# Patient Record
Sex: Male | Born: 1965 | Race: White | Hispanic: No | Smoking: Former smoker
Health system: Southern US, Community
[De-identification: ages and names within clinical notes are randomized; demographics above are authoritative.]

## PROBLEM LIST (undated history)

## (undated) DIAGNOSIS — I10 Essential (primary) hypertension: Secondary | ICD-10-CM

## (undated) HISTORY — PX: TOOTH EXTRACTION: SUR596

## (undated) HISTORY — DX: Essential (primary) hypertension: I10

---

## 1998-06-15 ENCOUNTER — Emergency Department (HOSPITAL_COMMUNITY): Admission: EM | Admit: 1998-06-15 | Discharge: 1998-06-15 | Payer: Self-pay | Admitting: Endocrinology

## 1999-03-12 ENCOUNTER — Emergency Department (HOSPITAL_COMMUNITY): Admission: EM | Admit: 1999-03-12 | Discharge: 1999-03-12 | Payer: Self-pay | Admitting: Emergency Medicine

## 2014-11-01 ENCOUNTER — Emergency Department (INDEPENDENT_AMBULATORY_CARE_PROVIDER_SITE_OTHER)
Admission: EM | Admit: 2014-11-01 | Discharge: 2014-11-01 | Disposition: A | Discharge status: Home / Self Care | Payer: Self-pay | Source: Home / Self Care | Attending: Internal Medicine | Admitting: Internal Medicine

## 2014-11-01 ENCOUNTER — Encounter (HOSPITAL_COMMUNITY): Payer: Self-pay | Admitting: Emergency Medicine

## 2014-11-01 DIAGNOSIS — S63602A Unspecified sprain of left thumb, initial encounter: Secondary | ICD-10-CM

## 2014-11-01 MED ORDER — NAPROXEN 500 MG PO TABS: 500.0000 mg | ORAL_TABLET | Freq: Two times a day (BID) | ORAL | Status: DC

## 2014-11-01 NOTE — Discharge Instructions (Signed)
Gamekeeper's, Skier's Thumb °You have injured some ligaments in your thumb. This can happen suddenly or over a long period of time. It may be difficult for you to hold things by pinching them between your thumb and finger. The injury may take 6 to 8 weeks to heal. Your injury is a strain injury and not a complete tear so it may be treated with a cast. A complete tear of the ligament can lead to a loss of function of the thumb if not fixed surgically. It got its name from when gamekeepers used to kill game (hunted or trapped animals) by pinning them down around the neck between their thumb and index finger.  °HOME CARE INSTRUCTIONS  °· Apply ice to the injury for 15-20 minutes, 03-04 times per day for the first 2 days. Put the ice in a plastic bag and place a towel between the bag of ice and your skin. °· Avoid using your thumb for as long as directed by your caregiver if it is not splinted or in a cast. °· If your hand has been casted or splinted while your thumb heals, follow these instructions: °· Plaster or fiberglass cast: °¨ Do not try to scratch the skin under the cast using a sharp or pointed object. °¨ Check the skin around the cast every day. You may put lotion on any red or sore areas. °¨ Keep your cast dry. Your cast can be protected during bathing with a plastic bag. Do not put your cast into the water. °¨ If your fiberglass cast gets wet, it can be gently dried using a hair dryer. Be careful not burn yourself. °· Plaster splint: °¨ Wear the splint for as long as directed by your caregiver or until you are seen for a follow-up examination. °¨ Do not get your splint wet. Protect it during bathing with a plastic bag. °¨ You may loosen the elastic bandage around the splint if your fingers start to get numb, tingle, get cold, or turn blue. °· Do not put pressure on your cast or splint; this may cause it to break. Do not lean on hard surfaces for 24 hours after application. °· Only take over-the-counter or  prescription medicines for pain, discomfort, or fever as directed by your caregiver. °· IMPORTANT: follow up with your caregiver or keep or call for any appointments with specialists as directed. The failure to follow up could result in chronic pain and / or disability. °SEEK IMMEDIATE MEDICAL CARE IF:  °Your thumb or fingers change color, become painful, or there is numbness or tingling in your thumb or fingers. Your cast or splint may be too tight. °MAKE SURE YOU:  °· Understand these instructions. °· Will watch your condition. °· Will get help right away if you are not doing well or get worse. °Document Released: 05/17/2005 Document Revised: 08/09/2011 Document Reviewed: 01/04/2008 °ExitCare® Patient Information ©2015 [employer]. This information is not intended to replace advice given to you by your health care provider. Make sure you discuss any questions you have with your health care provider. ° °
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
## 2014-11-08 ENCOUNTER — Emergency Department (INDEPENDENT_AMBULATORY_CARE_PROVIDER_SITE_OTHER): Payer: Self-pay

## 2014-11-08 ENCOUNTER — Emergency Department (INDEPENDENT_AMBULATORY_CARE_PROVIDER_SITE_OTHER)
Admission: EM | Admit: 2014-11-08 | Discharge: 2014-11-08 | Disposition: A | Discharge status: Home / Self Care | Payer: Self-pay | Source: Home / Self Care | Attending: Emergency Medicine | Admitting: Emergency Medicine

## 2014-11-08 ENCOUNTER — Encounter (HOSPITAL_COMMUNITY): Payer: Self-pay | Admitting: Emergency Medicine

## 2014-11-08 DIAGNOSIS — S63602D Unspecified sprain of left thumb, subsequent encounter: Secondary | ICD-10-CM

## 2014-11-08 NOTE — ED Notes (Signed)
Patient presents for recheck of thumb injury x 1 week ago. Patient reports he did not have his Rx filled. Patient reports he has had thumb pain in different areas. Patient is in NAD.
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
## 2014-11-08 NOTE — Discharge Instructions (Signed)
Continue to use the brace. Remove the brace and do gentle range of motion 3 times a day. Take ibuprofen 600 mg 3 times a day for the next 3 days, then as needed. If you are not improving in the next 1-2 weeks, please follow-up at the sports medicine center.

## 2016-12-17 DIAGNOSIS — I10 Essential (primary) hypertension: Secondary | ICD-10-CM | POA: Diagnosis not present

## 2016-12-17 DIAGNOSIS — R5383 Other fatigue: Secondary | ICD-10-CM | POA: Diagnosis not present

## 2017-04-13 DIAGNOSIS — I10 Essential (primary) hypertension: Secondary | ICD-10-CM | POA: Diagnosis not present

## 2017-10-11 DIAGNOSIS — R609 Edema, unspecified: Secondary | ICD-10-CM | POA: Diagnosis not present

## 2017-10-11 DIAGNOSIS — I10 Essential (primary) hypertension: Secondary | ICD-10-CM | POA: Diagnosis not present

## 2017-10-11 DIAGNOSIS — L0390 Cellulitis, unspecified: Secondary | ICD-10-CM | POA: Diagnosis not present

## 2017-10-25 DIAGNOSIS — R609 Edema, unspecified: Secondary | ICD-10-CM | POA: Diagnosis not present

## 2017-10-25 DIAGNOSIS — I10 Essential (primary) hypertension: Secondary | ICD-10-CM | POA: Diagnosis not present

## 2017-10-26 ENCOUNTER — Ambulatory Visit
Admission: RE | Admit: 2017-10-26 | Discharge: 2017-10-26 | Disposition: A | Discharge status: Home / Self Care | Payer: 59 | Source: Ambulatory Visit | Attending: Family Medicine | Admitting: Family Medicine

## 2017-10-26 ENCOUNTER — Other Ambulatory Visit: Payer: Self-pay

## 2017-10-26 DIAGNOSIS — M7989 Other specified soft tissue disorders: Secondary | ICD-10-CM | POA: Diagnosis not present

## 2017-10-26 DIAGNOSIS — R609 Edema, unspecified: Secondary | ICD-10-CM

## 2017-10-26 DIAGNOSIS — M79605 Pain in left leg: Secondary | ICD-10-CM

## 2017-11-10 DIAGNOSIS — R609 Edema, unspecified: Secondary | ICD-10-CM | POA: Diagnosis not present

## 2017-11-10 DIAGNOSIS — I10 Essential (primary) hypertension: Secondary | ICD-10-CM | POA: Diagnosis not present

## 2017-11-22 ENCOUNTER — Encounter: Payer: Self-pay | Admitting: Cardiovascular Disease

## 2017-11-22 ENCOUNTER — Ambulatory Visit (INDEPENDENT_AMBULATORY_CARE_PROVIDER_SITE_OTHER): Payer: 59 | Admitting: Cardiovascular Disease

## 2017-11-22 VITALS — BP 128/92 | HR 79 | Ht 70.0 in | Wt 256.0 lb

## 2017-11-22 DIAGNOSIS — I10 Essential (primary) hypertension: Secondary | ICD-10-CM | POA: Insufficient documentation

## 2017-11-22 DIAGNOSIS — R601 Generalized edema: Secondary | ICD-10-CM | POA: Diagnosis not present

## 2017-11-22 DIAGNOSIS — R600 Localized edema: Secondary | ICD-10-CM | POA: Insufficient documentation

## 2017-11-22 MED ORDER — FUROSEMIDE 20 MG PO TABS: 40.0000 mg | ORAL_TABLET | Freq: Every day | ORAL | 3 refills | Status: DC

## 2017-11-22 NOTE — Assessment & Plan Note (Signed)
Essential hypertension her blood pressure measured today 120/92.  He is on Bystolic.  Continue current meds at current dosing.

## 2017-11-22 NOTE — Assessment & Plan Note (Signed)
Mr. Dylan Martinez was referred by Dr. Clovis RileyMitchell for evaluation of bilateral lower extremity edema.  This began 2 months ago.  He has had venous Doppler studies that did not show DVT or venous insufficiency.  He has been treated with antibiotics for left lower extremity cellulitis.  The pain in his left leg has improved.  He was placed on oral diuretics which did not significantly improve his edema.  He denies chest pain or shortness of breath.  I am going to increase his furosemide from 20 mg in the morning to 40 mg and will check a basic metabolic panel in 2 weeks.  I am also going to get a 2D echocardiogram and will see him back after that for further evaluation.

## 2017-11-22 NOTE — Progress Notes (Signed)
11/22/2017 Dylan Martinez   09-16-1965  [id number]  Primary Physician Dylan Martinez, L.Dylan Saucer, MD Primary Cardiologist: Dylan Gess MD Dylan Martinez, Dylan Martinez, [place]  HPI:  Dylan Martinez is a 52 y.o. moderately overweight recently divorced Caucasian male father of 2 children referred by Dr. Lupe Martinez for cardiovascular evaluation because of lower extremity edema.  He does have a history of essential hypertension on medications.  He works as a Psychologist, counselling.  His risk factors include 70 pack years of tobacco abuse having quit 3 years ago as well as treated hypertension.  There is no family history.  Is never had a heart attack or stroke.  He denies chest pain or shortness of breath.  He does drink 2 mixed drinks and evening.  He began to notice lower extremity edema bilaterally 2 months ago.  He had lower extremity venous Doppler studies performed 10/26/2017 that ruled out DVT.  There is no venous reflux noted either.  He is placed on oral diuretics with minimal response.  He was also treated with antibiotics for cellulitis.   Current Meds  Medication Sig  . furosemide (LASIX) 20 MG tablet Take 2 tablets (40 mg total) by mouth daily.  . nebivolol (BYSTOLIC) 5 MG tablet Take 5 mg by mouth daily.  . [DISCONTINUED] furosemide (LASIX) 20 MG tablet Take 20 mg by mouth 2 (two) times daily.  . [DISCONTINUED] lisinopril-hydrochlorothiazide (PRINZIDE,ZESTORETIC) 20-25 MG per tablet Take 1 tablet by mouth daily.  . [DISCONTINUED] naproxen (NAPROSYN) 500 MG tablet Take 1 tablet (500 mg total) by mouth 2 (two) times daily.     Allergies  Allergen Reactions  . Doxycycline Rash    Social History   Socioeconomic History  . Marital status: Married    Spouse name: Not on file  . Number of children: Not on file  . Years of education: Not on file  . Highest education level: Not on file  Occupational History  . Not on file  Social Needs  . Financial resource strain: Not on file  . Food insecurity:      Worry: Not on file    Inability: Not on file  . Transportation needs:    Medical: Not on file    Non-medical: Not on file  Tobacco Use  . Smoking status: Former Smoker    Packs/day: 1.00    Years: 35.00    Pack years: 35.00    Types: Cigarettes, E-cigarettes    Last attempt to quit: 08/30/2014    Years since quitting: 3.2  . Smokeless tobacco: Never Used  Substance and Sexual Activity  . Alcohol use: Yes    Comment: once a month  . Drug use: No  . Sexual activity: Not on file  Lifestyle  . Physical activity:    Days per week: Not on file    Minutes per session: Not on file  . Stress: Not on file  Relationships  . Social connections:    Talks on phone: Not on file    Gets together: Not on file    Attends religious service: Not on file    Active member of club or organization: Not on file    Attends meetings of clubs or organizations: Not on file    Relationship status: Not on file  . Intimate partner violence:    Fear of current or ex partner: Not on file    Emotionally abused: Not on file    Physically abused: Not on file  Forced sexual activity: Not on file  Other Topics Concern  . Not on file  Social History Narrative  . Not on file     Review of Systems: General: negative for chills, fever, night sweats or weight changes.  Cardiovascular: negative for chest pain, dyspnea on exertion, edema, orthopnea, palpitations, paroxysmal nocturnal dyspnea or shortness of breath Dermatological: negative for rash Respiratory: negative for cough or wheezing Urologic: negative for hematuria Abdominal: negative for nausea, vomiting, diarrhea, bright red blood per rectum, melena, or hematemesis Neurologic: negative for visual changes, syncope, or dizziness All other systems reviewed and are otherwise negative except as noted above.    Blood pressure (!) 128/92, pulse 79, height 5\' 10"  (1.778 m), weight 256 lb (116.1 kg).  General appearance: alert and no distress Neck:  no adenopathy, no carotid bruit, no JVD, supple, symmetrical, trachea midline and thyroid not enlarged, symmetric, no tenderness/mass/nodules Lungs: clear to auscultation bilaterally Heart: regular rate and rhythm, S1, S2 normal, no murmur, click, rub or gallop Extremities: 1-2+ pitting edema bilaterally.  There was some venous stasis changes are resolving cellulitis changes on the left. Pulses: 2+ and symmetric Skin: Skin color, texture, turgor normal. No rashes or lesions Neurologic: Alert and oriented X 3, normal strength and tone. Normal symmetric reflexes. Normal coordination and gait  EKG sinus rhythm at 79 without ST or T wave changes.  I personally reviewed this EKG  ASSESSMENT AND PLAN:   Essential hypertension Essential hypertension her blood pressure measured today 120/92.  He is on Bystolic.  Continue current meds at current dosing.  Bilateral lower extremity edema Mr. Dylan Martinez was referred by Dr. Clovis RileyMitchell for evaluation of bilateral lower extremity edema.  This began 2 months ago.  He has had venous Doppler studies that did not show DVT or venous insufficiency.  He has been treated with antibiotics for left lower extremity cellulitis.  The pain in his left leg has improved.  He was placed on oral diuretics which did not significantly improve his edema.  He denies chest pain or shortness of breath.  I am going to increase his furosemide from 20 mg in the morning to 40 mg and will check a basic metabolic panel in 2 weeks.  I am also going to get a 2D echocardiogram and will see him back after that for further evaluation.      Dylan GessJonathan J. Camila Maita MD FACP,FACC,FAHA, [facility] 11/22/2017 3:52 PM

## 2017-11-22 NOTE — Patient Instructions (Signed)
Medication Instructions:   INCREASE FUROSEMIDE TO 40 MG ONCE DAILY= 2 OF THE 20 MG TABLETS ONCE DAILY  Labwork:  Your physician recommends that you return for lab work in: ONE WEEK  Testing/Procedures:  Your physician has requested that you have an echocardiogram. Echocardiography is a painless test that uses sound waves to create images of your heart. It provides your doctor with information about the size and shape of your heart and how well your heart's chambers and valves are working. This procedure takes approximately one hour. There are no restrictions for this procedure.    Follow-Up:  Your physician recommends that you schedule a follow-up appointment in: 3 WEEKS WITH DR Allyson SabalBERRY

## 2017-11-24 ENCOUNTER — Ambulatory Visit (HOSPITAL_COMMUNITY): Payer: 59 | Attending: Internal Medicine

## 2017-11-24 ENCOUNTER — Other Ambulatory Visit: Payer: Self-pay

## 2017-11-24 DIAGNOSIS — Z87891 Personal history of nicotine dependence: Secondary | ICD-10-CM | POA: Insufficient documentation

## 2017-11-24 DIAGNOSIS — E669 Obesity, unspecified: Secondary | ICD-10-CM | POA: Diagnosis not present

## 2017-11-24 DIAGNOSIS — R601 Generalized edema: Secondary | ICD-10-CM | POA: Diagnosis not present

## 2017-11-24 DIAGNOSIS — I10 Essential (primary) hypertension: Secondary | ICD-10-CM | POA: Insufficient documentation

## 2017-11-24 DIAGNOSIS — R609 Edema, unspecified: Secondary | ICD-10-CM | POA: Insufficient documentation

## 2017-12-13 ENCOUNTER — Encounter: Payer: Self-pay | Admitting: Cardiovascular Disease

## 2017-12-13 ENCOUNTER — Ambulatory Visit (INDEPENDENT_AMBULATORY_CARE_PROVIDER_SITE_OTHER): Payer: 59 | Admitting: Cardiovascular Disease

## 2017-12-13 VITALS — BP 128/82 | HR 79 | Ht 70.0 in | Wt 258.0 lb

## 2017-12-13 DIAGNOSIS — R601 Generalized edema: Secondary | ICD-10-CM

## 2017-12-13 NOTE — Patient Instructions (Signed)
Medication Instructions:   NO CHANGE  Labwork:  Your physician recommends that you HAVE LAB WORK TODAY  Follow-Up:  Your physician recommends that you schedule a follow-up appointment in: AS NEEDED      

## 2017-12-13 NOTE — Progress Notes (Signed)
Mr. Dylan Martinez returns today for follow-up of his 2D echo performed 11/24/2017 entirely normal.  This obviously is not the cause of his edema.  His venous Dopplers are normal as well for no evidence of DVT or reflux.  I did increase his furosemide from 20 to 40 mg a day which did not improve his edema but it did increase his urination.  I am going to get a 24-hour urine for total protein to rule out nephrotic syndrome.  If this is normal I have no further ideas to explain his edema.   Runell GessJonathan J. Armya Westerhoff, M.D., FACP, [facility], Earl LagosFAHA, [facility] [address] [place], [place]  [id number]  [phone] 12/13/2017 3:28 PM

## 2017-12-13 NOTE — Assessment & Plan Note (Signed)
Mr. Oswaldo DoneVincent returns today for follow-up of his 2D echo performed 11/24/2017 entirely normal.  This obviously is not the cause of his edema.  His venous Dopplers are normal as well for no evidence of DVT or reflux.  I did increase his furosemide from 20 to 40 mg a day which did not improve his edema but it did increase his urination.  I am going to get a 24-hour urine for total protein to rule out nephrotic syndrome.  If this is normal I have no further ideas to explain his edema.

## 2017-12-19 ENCOUNTER — Encounter: Payer: Self-pay | Admitting: Cardiovascular Disease

## 2017-12-19 DIAGNOSIS — R601 Generalized edema: Secondary | ICD-10-CM | POA: Diagnosis not present

## 2017-12-19 LAB — PROTEIN, URINE, 24 HOUR
PROTEIN 24H UR: 70 mg/(24.h) (ref 30–150)
Protein, Ur: 6.8 mg/dL

## 2018-01-19 ENCOUNTER — Other Ambulatory Visit: Payer: Self-pay | Admitting: Family Medicine

## 2018-01-19 DIAGNOSIS — R609 Edema, unspecified: Secondary | ICD-10-CM

## 2018-01-27 ENCOUNTER — Ambulatory Visit
Admission: RE | Admit: 2018-01-27 | Discharge: 2018-01-27 | Disposition: A | Discharge status: Home / Self Care | Payer: 59 | Source: Ambulatory Visit | Attending: Family Medicine | Admitting: Family Medicine

## 2018-01-27 DIAGNOSIS — K8020 Calculus of gallbladder without cholecystitis without obstruction: Secondary | ICD-10-CM | POA: Diagnosis not present

## 2018-01-27 DIAGNOSIS — R609 Edema, unspecified: Secondary | ICD-10-CM

## 2018-03-31 DIAGNOSIS — R21 Rash and other nonspecific skin eruption: Secondary | ICD-10-CM | POA: Diagnosis not present

## 2018-03-31 DIAGNOSIS — M359 Systemic involvement of connective tissue, unspecified: Secondary | ICD-10-CM | POA: Diagnosis not present

## 2018-03-31 DIAGNOSIS — I872 Venous insufficiency (chronic) (peripheral): Secondary | ICD-10-CM | POA: Diagnosis not present

## 2018-05-01 DIAGNOSIS — E669 Obesity, unspecified: Secondary | ICD-10-CM | POA: Diagnosis not present

## 2018-05-01 DIAGNOSIS — I10 Essential (primary) hypertension: Secondary | ICD-10-CM | POA: Diagnosis not present

## 2018-05-01 DIAGNOSIS — R609 Edema, unspecified: Secondary | ICD-10-CM | POA: Diagnosis not present

## 2018-05-10 ENCOUNTER — Encounter (HOSPITAL_COMMUNITY): Payer: 59

## 2018-05-18 ENCOUNTER — Other Ambulatory Visit (HOSPITAL_COMMUNITY): Payer: Self-pay

## 2018-05-18 DIAGNOSIS — R0989 Other specified symptoms and signs involving the circulatory and respiratory systems: Secondary | ICD-10-CM

## 2018-05-19 DIAGNOSIS — I872 Venous insufficiency (chronic) (peripheral): Secondary | ICD-10-CM | POA: Diagnosis not present

## 2018-05-22 ENCOUNTER — Inpatient Hospital Stay (HOSPITAL_COMMUNITY): Admission: RE | Admit: 2018-05-22 | Payer: 59 | Source: Ambulatory Visit

## 2018-06-07 ENCOUNTER — Encounter (HOSPITAL_COMMUNITY): Payer: 59

## 2018-06-21 ENCOUNTER — Inpatient Hospital Stay (HOSPITAL_COMMUNITY): Admission: RE | Admit: 2018-06-21 | Payer: 59 | Source: Ambulatory Visit

## 2018-06-29 DIAGNOSIS — I872 Venous insufficiency (chronic) (peripheral): Secondary | ICD-10-CM | POA: Diagnosis not present

## 2018-07-12 ENCOUNTER — Encounter (HOSPITAL_COMMUNITY): Payer: 59

## 2019-10-18 ENCOUNTER — Other Ambulatory Visit: Payer: Self-pay

## 2019-10-18 DIAGNOSIS — R42 Dizziness and giddiness: Secondary | ICD-10-CM

## 2019-10-27 ENCOUNTER — Ambulatory Visit
Admission: RE | Admit: 2019-10-27 | Discharge: 2019-10-27 | Disposition: A | Discharge status: Home / Self Care | Payer: 59 | Source: Ambulatory Visit | Attending: Family Medicine | Admitting: Family Medicine

## 2019-10-27 ENCOUNTER — Other Ambulatory Visit: Payer: Self-pay

## 2019-10-27 DIAGNOSIS — R42 Dizziness and giddiness: Secondary | ICD-10-CM

## 2019-10-27 MED ORDER — GADOBENATE DIMEGLUMINE 529 MG/ML IV SOLN
20.0000 mL | Freq: Once | INTRAVENOUS | Status: AC | PRN
  Administered 2019-10-27: 20 mL via INTRAVENOUS

## 2019-10-28 IMAGING — US US EXTREM LOW VENOUS BILAT
1 series · 13 of 24 positions shown · non-contrast
Comparison: None.

CLINICAL DATA: Bilateral lower extremity pain, swelling



[Series 1: us extrem low venous bilat · 0.09mm/px · 13 of 62 slices shown]
[im 1/62]
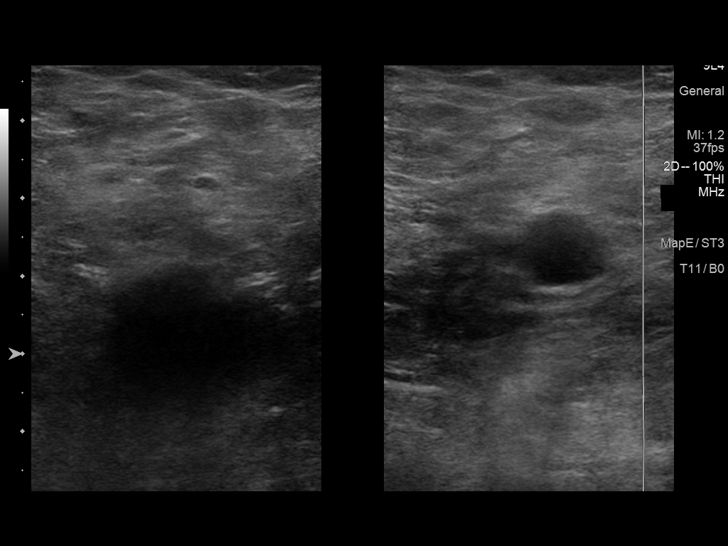
[im 6/62]
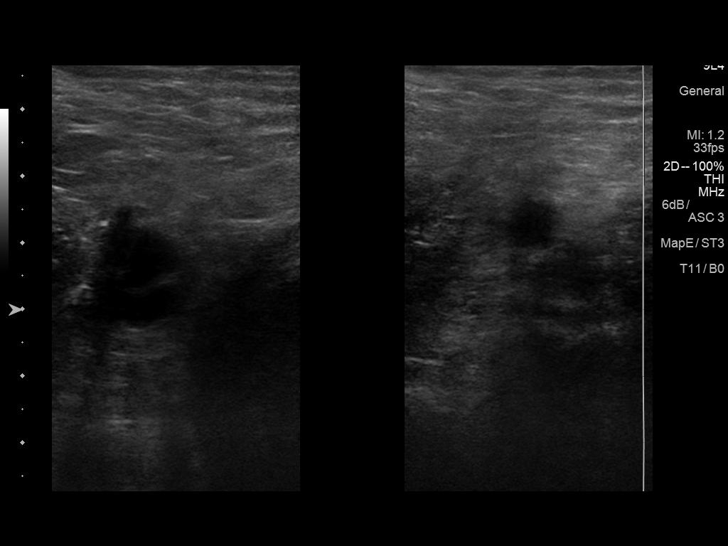
[im 11/62]
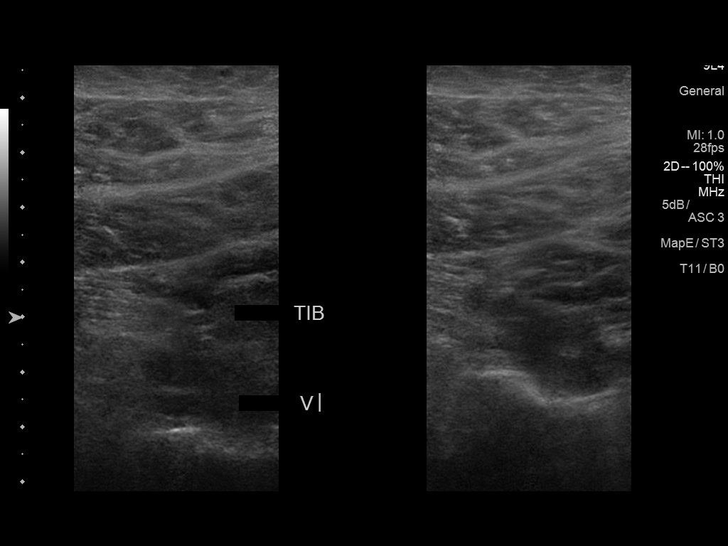
[im 16/62]
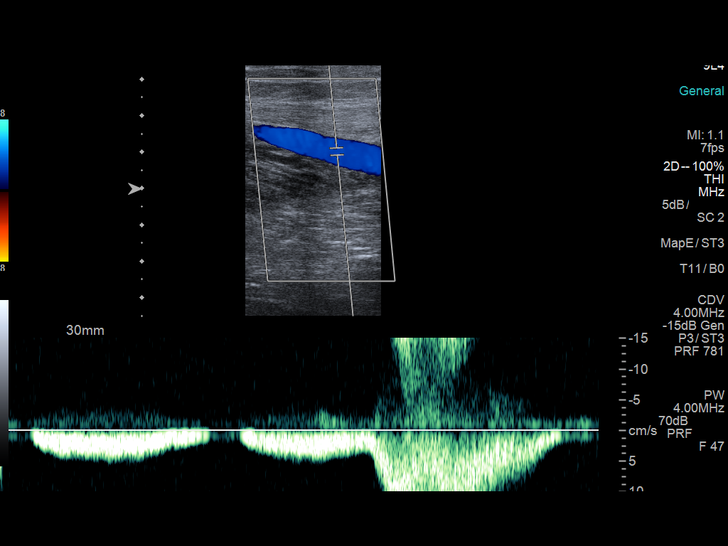
[im 22/62]
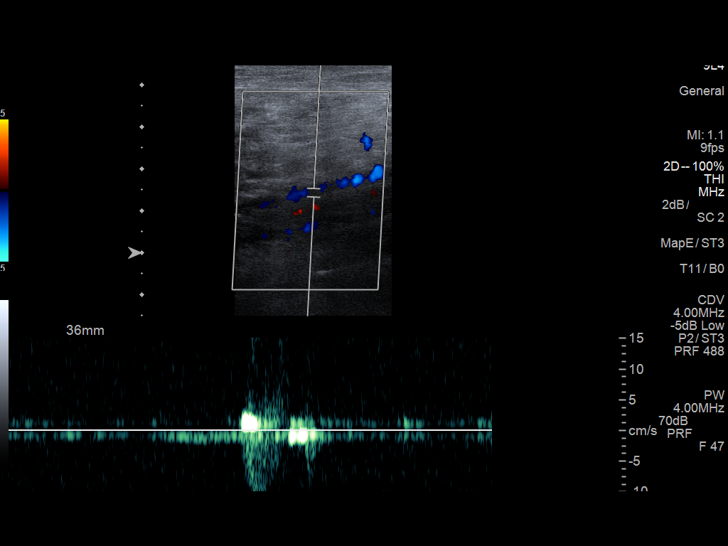
[im 27/62]
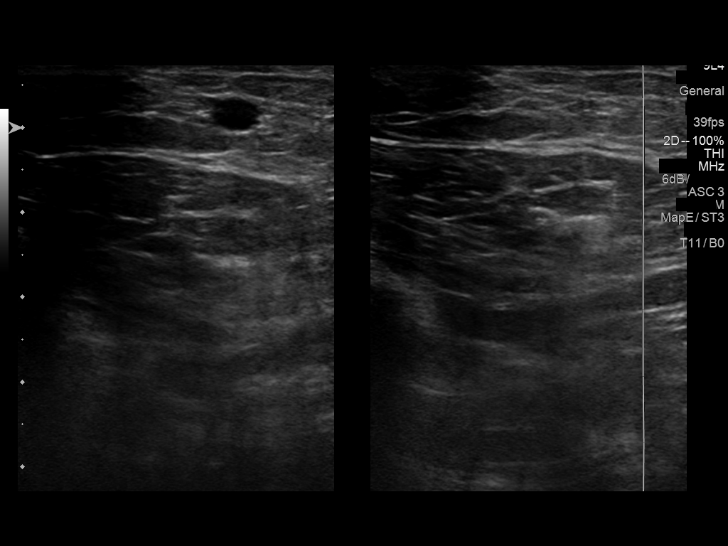
[im 32/62]
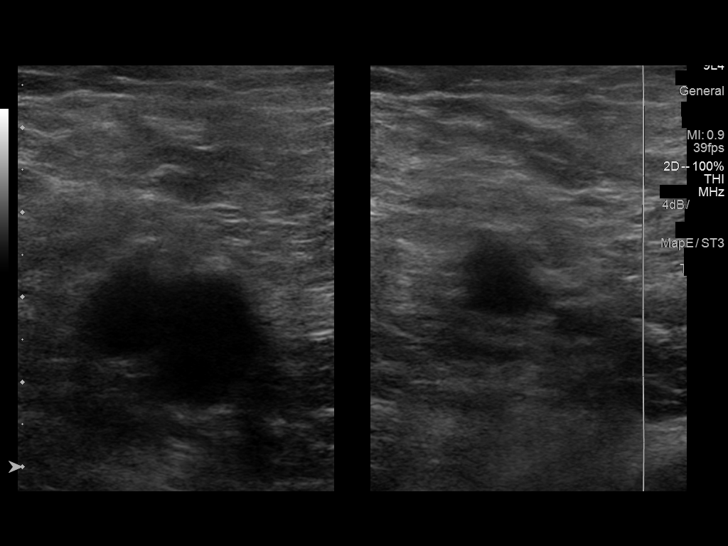
[im 35/62]
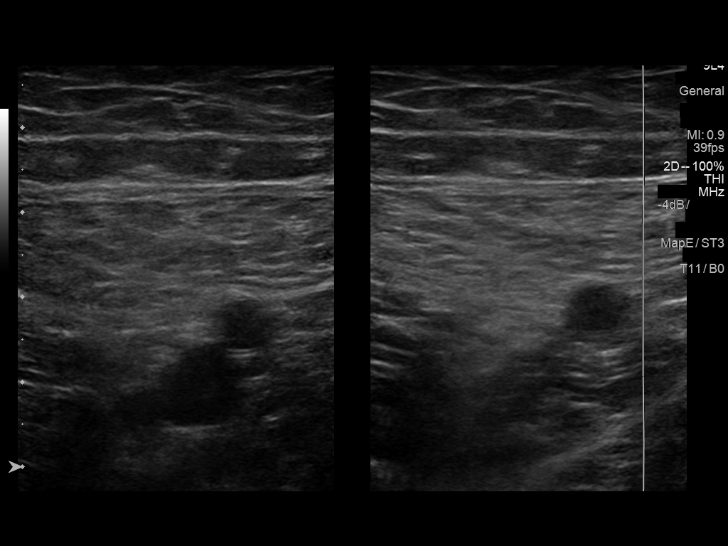
[im 40/62]
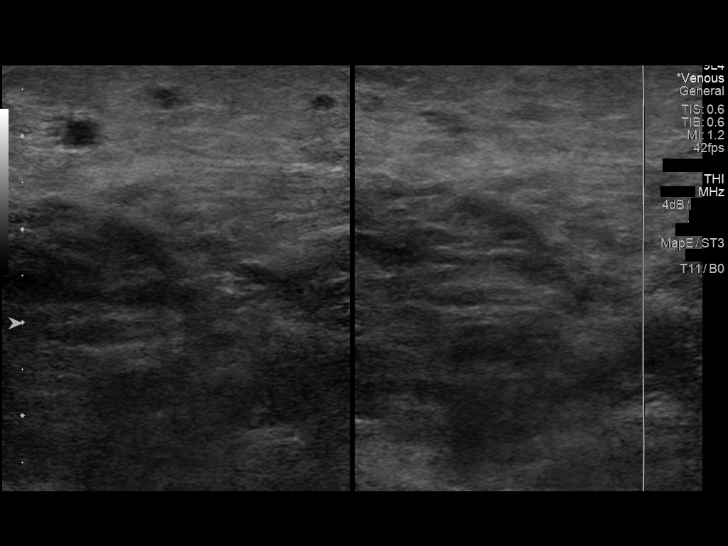
[im 46/62]
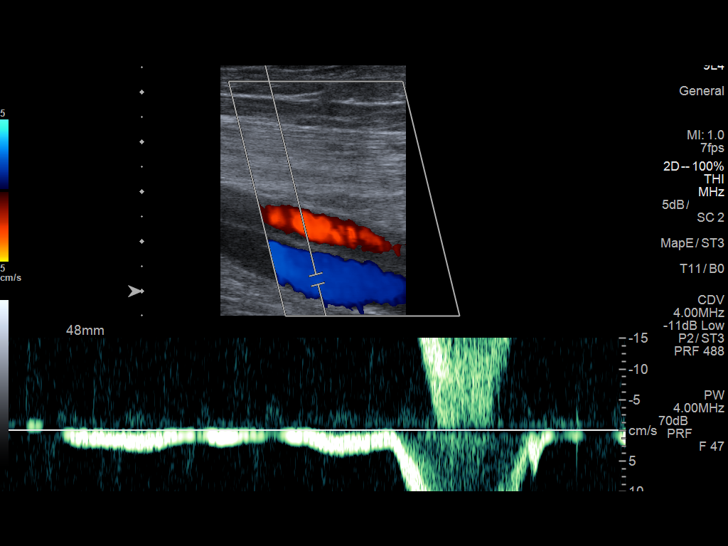
[im 51/62]
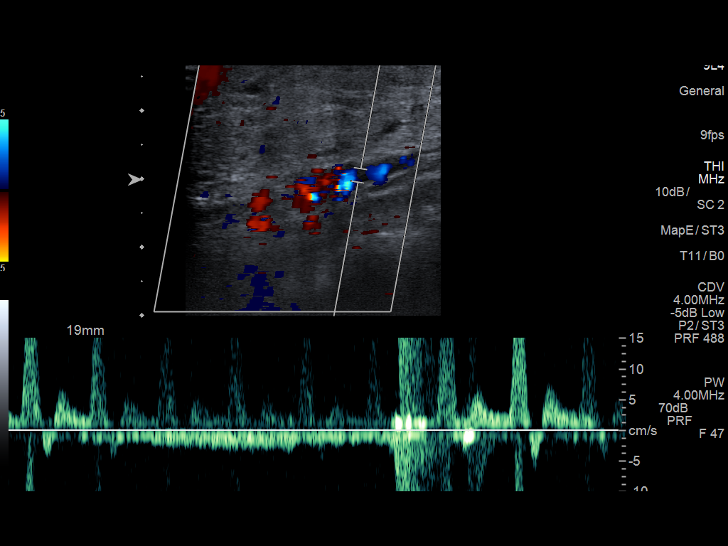
[im 56/62]
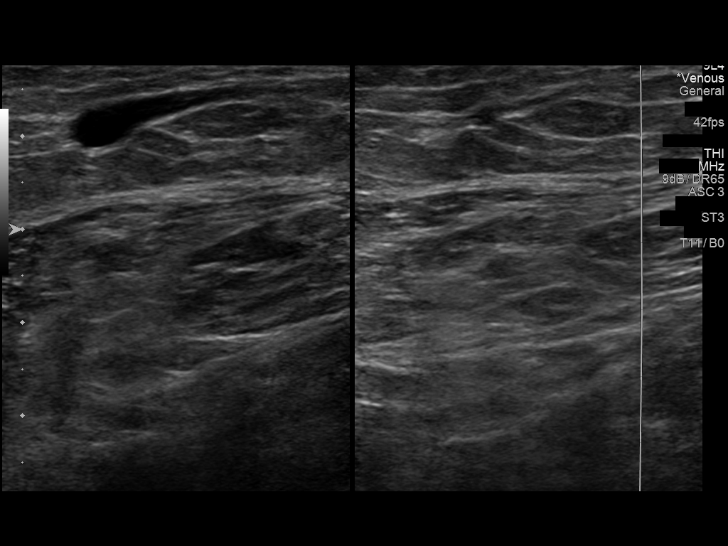
[im 62/62]
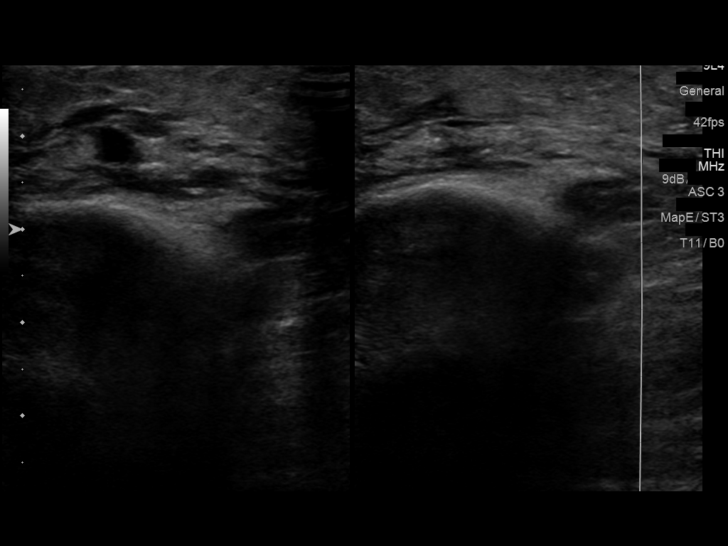

[13 of 24 positions shown; findings below may reference images not displayed]

FINDINGS: RIGHT LOWER EXTREMITY

Common Femoral Vein: No evidence of thrombus. Normal
compressibility, respiratory phasicity and response to augmentation.

Saphenofemoral Junction: No evidence of thrombus. Normal
compressibility and flow on color Doppler imaging.

Profunda Femoral Vein: No evidence of thrombus. Normal
compressibility and flow on color Doppler imaging.

Femoral Vein: No evidence of thrombus. Normal compressibility,
respiratory phasicity and response to augmentation.

Popliteal Vein: No evidence of thrombus. Normal compressibility,
respiratory phasicity and response to augmentation.

Calf Veins: No evidence of thrombus. Normal compressibility and flow
on color Doppler imaging.

Superficial Great Saphenous Vein: No evidence of thrombus. Normal
compressibility.

Venous Reflux:  None.

Other Findings:  Subcutaneous edema noted.

LEFT LOWER EXTREMITY

Common Femoral Vein: No evidence of thrombus. Normal
compressibility, respiratory phasicity and response to augmentation.

Saphenofemoral Junction: No evidence of thrombus. Normal
compressibility and flow on color Doppler imaging.

Profunda Femoral Vein: No evidence of thrombus. Normal
compressibility and flow on color Doppler imaging.

Femoral Vein: No evidence of thrombus. Normal compressibility,
respiratory phasicity and response to augmentation.

Popliteal Vein: No evidence of thrombus. Normal compressibility,
respiratory phasicity and response to augmentation.

Calf Veins: No evidence of thrombus. Normal compressibility and flow
on color Doppler imaging.

Superficial Great Saphenous Vein: No evidence of thrombus. Normal
compressibility.

Venous Reflux:  None.

Other Findings:  Subcutaneous edema noted.
IMPRESSION: No evidence of deep venous thrombosis.

## 2019-11-20 ENCOUNTER — Other Ambulatory Visit: Payer: 59

## 2020-11-04 DIAGNOSIS — I10 Essential (primary) hypertension: Secondary | ICD-10-CM | POA: Diagnosis not present

## 2020-11-04 DIAGNOSIS — R42 Dizziness and giddiness: Secondary | ICD-10-CM | POA: Diagnosis not present

## 2021-04-28 DIAGNOSIS — R42 Dizziness and giddiness: Secondary | ICD-10-CM | POA: Diagnosis not present

## 2021-04-29 DIAGNOSIS — R42 Dizziness and giddiness: Secondary | ICD-10-CM | POA: Diagnosis not present

## 2021-05-05 DIAGNOSIS — R42 Dizziness and giddiness: Secondary | ICD-10-CM | POA: Diagnosis not present

## 2021-07-07 DIAGNOSIS — Z0001 Encounter for general adult medical examination with abnormal findings: Secondary | ICD-10-CM | POA: Diagnosis not present

## 2021-07-07 DIAGNOSIS — I10 Essential (primary) hypertension: Secondary | ICD-10-CM | POA: Diagnosis not present

## 2021-07-07 DIAGNOSIS — Z125 Encounter for screening for malignant neoplasm of prostate: Secondary | ICD-10-CM | POA: Diagnosis not present

## 2022-01-05 DIAGNOSIS — Z23 Encounter for immunization: Secondary | ICD-10-CM | POA: Diagnosis not present

## 2022-01-05 DIAGNOSIS — E669 Obesity, unspecified: Secondary | ICD-10-CM | POA: Diagnosis not present

## 2022-01-05 DIAGNOSIS — R42 Dizziness and giddiness: Secondary | ICD-10-CM | POA: Diagnosis not present

## 2022-01-05 DIAGNOSIS — I10 Essential (primary) hypertension: Secondary | ICD-10-CM | POA: Diagnosis not present

## 2022-02-08 DIAGNOSIS — R42 Dizziness and giddiness: Secondary | ICD-10-CM | POA: Diagnosis not present

## 2022-03-01 DIAGNOSIS — H9042 Sensorineural hearing loss, unilateral, left ear, with unrestricted hearing on the contralateral side: Secondary | ICD-10-CM | POA: Diagnosis not present

## 2022-03-01 DIAGNOSIS — R42 Dizziness and giddiness: Secondary | ICD-10-CM | POA: Diagnosis not present

## 2022-03-10 DIAGNOSIS — R42 Dizziness and giddiness: Secondary | ICD-10-CM | POA: Diagnosis not present

## 2022-03-10 DIAGNOSIS — R410 Disorientation, unspecified: Secondary | ICD-10-CM | POA: Diagnosis not present

## 2022-03-30 ENCOUNTER — Encounter: Payer: Self-pay | Admitting: Neurology

## 2022-05-05 ENCOUNTER — Encounter: Payer: Self-pay | Admitting: Neurology

## 2022-05-05 ENCOUNTER — Ambulatory Visit: Payer: BC Managed Care – PPO | Admitting: Neurology

## 2022-05-05 VITALS — Ht 70.0 in | Wt 247.4 lb

## 2022-05-05 DIAGNOSIS — R55 Syncope and collapse: Secondary | ICD-10-CM

## 2022-05-05 DIAGNOSIS — R42 Dizziness and giddiness: Secondary | ICD-10-CM | POA: Insufficient documentation

## 2022-05-05 DIAGNOSIS — G450 Vertebro-basilar artery syndrome: Secondary | ICD-10-CM
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
## 2022-05-05 NOTE — Patient Instructions (Addendum)
Given symptoms happen with lights flashing/strobes, could get an EEG and replicate and see if this is a seizure-like process Given worsening with head extension we could get CT Angiogram of the head and neck and make sure no vertebrobasilar insufficiency One lab  Could try Topiramate after the above for vertigo/dizziness associated with a migrainous process or possible this could be an aura (although I think less likely but can't rule it out) Magnesium  a day very good for migraine aura(dizziness be an aura?) could try that just OTC (Mag citrate, Mag oxide, Mag glycinate) This is NOT my area of expertise and I don't know if there is any mechanism that can cause arrhythmia with patient's triggers, may consider zio patch but would talk to pcp because again just trying to think of anything we can consider  Gave information on Vertigo/dizziness associated with migraine and migraine with aura    Topiramate Tablets What is this medication? TOPIRAMATE (toe PYRE a mate) prevents and controls seizures in people with epilepsy. It may also be used to prevent migraine headaches. It works by calming overactive nerves in your body. This medicine may be used for other purposes; ask your health care provider or pharmacist if you have questions. COMMON BRAND NAME(S): Topamax, Topiragen What should I tell my care team before I take this medication? They need to know if you have any of these conditions: Bleeding disorder Kidney disease Lung disease Suicidal thoughts, plans, or attempt by you or a family member An unusual or allergic reaction to topiramate, other medications, foods, dyes, or preservatives Pregnant or trying to get pregnant Breast-feeding How should I use this medication? Take this medication by mouth with water. Take it as directed on the prescription label at the same time every day. Do not cut, crush or chew this medicine. Swallow the tablets whole. You can take it with or without food.  If it upsets your stomach, take it with food. Keep taking it unless your care team tells you to stop. A special MedGuide will be given to you by the pharmacist with each prescription and refill. Be sure to read this information carefully each time. Talk to your care team about the use of this medication in children. While it may be prescribed for children as young as 2 years for selected conditions, precautions do apply. Overdosage: If you think you have taken too much of this medicine contact a poison control center or emergency room at once. NOTE: This medicine is only for you. Do not share this medicine with others. What if I miss a dose? If you miss a dose, take it as soon as you can unless it is within 6 hours of the next dose. If it is within 6 hours of the next dose, skip the missed dose. Take the next dose at the normal time. Do not take double or extra doses. What may interact with this medication? Acetazolamide Alcohol Antihistamines for allergy, cough, and cold Aspirin and aspirin-like medications Atropine Certain medications for anxiety or sleep Certain medications for bladder problems, such as oxybutynin, tolterodine Certain medications for depression, such as amitriptyline, fluoxetine, sertraline Certain medications for Parkinson disease, such as benztropine, trihexyphenidyl Certain medications for seizures, such as carbamazepine, lamotrigine, phenobarbital, phenytoin, primidone, valproic acid, zonisamide Certain medications for stomach problems, such as dicyclomine, hyoscyamine Certain medications for travel sickness, such as scopolamine Certain medications that treat or prevent blood clots, such as warfarin, enoxaparin, dalteparin, apixaban, dabigatran, rivaroxaban Digoxin Diltiazem Estrogen and progestin hormones General anesthetics,  such as halothane, isoflurane, methoxyflurane, propofol Glyburide Hydrochlorothiazide Ipratropium Lithium Medications that relax  muscles Metformin NSAIDs, medications for pain and inflammation, such as ibuprofen or naproxen Opioid medications for pain Phenothiazines, such as chlorpromazine, mesoridazine, prochlorperazine, thioridazine Pioglitazone This list may not describe all possible interactions. Give your health care provider a list of all the medicines, herbs, non-prescription drugs, or dietary supplements you use. Also tell them if you smoke, drink alcohol, or use illegal drugs. Some items may interact with your medicine. What should I watch for while using this medication? Visit your care team for regular checks on your progress. Tell your care team if your symptoms do not start to get better or if they get worse. Do not suddenly stop taking this medication. You may develop a severe reaction. Your care team will tell you how much medication to take. If your care team wants you to stop the medication, the dose may be slowly lowered over time to avoid any side effects. Wear a medical ID bracelet or chain. Carry a card that describes your condition. List the medications and doses you take on the card. This medication may affect your coordination, reaction time, or judgment. Do not drive or operate machinery until you know how this medication affects you. Sit up or stand slowly to reduce the risk of dizzy or fainting spells. Drinking alcohol with this medication can increase the risk of these side effects. This medication may cause serious skin reactions. They can happen weeks to months after starting the medication. Contact your care team right away if you notice fevers or flu-like symptoms with a rash. The rash may be red or purple and then turn into blisters or peeling of the skin. You may also notice a red rash with swelling of the face, lips, or lymph nodes in your neck or under your arms. This medication may cause thoughts of suicide or depression. This includes sudden changes in mood, behaviors, or thoughts. These  changes can happen at any time but are more common in the beginning of treatment or after a change in dose. Call your care team right away if you experience these thoughts or worsening depression. This medication may slow your child's growth if it is taken for a long time at high doses. Your child's care team will monitor your child's growth. Using this medication for a long time may weaken your bones. The risk of bone fractures may be increased. Talk to your care team about your bone health. Discuss this medication with your care team if you may be pregnant. Serious birth defects can occur if you take this medication during pregnancy. There are benefits and risks to taking medications during pregnancy. Your care team can help you find the option that works for you. Contraception is recommended while taking this medication. Estrogen and progestin hormones may not work as well while you are taking this medication. Your care team can help you find the option that works for you. Talk to your care team before breastfeeding. Changes to your treatment plan may be needed. What side effects may I notice from receiving this medication? Side effects that you should report to your care team as soon as possible: Allergic reactions--skin rash, itching, hives, swelling of the face, lips, tongue, or throat High acid level--trouble breathing, unusual weakness or fatigue, confusion, headache, fast or irregular heartbeat, nausea, vomiting High ammonia level--unusual weakness or fatigue, confusion, loss of appetite, nausea, vomiting, seizures Fever that does not go away, decrease in sweat Kidney  stones--blood in the urine, pain or trouble passing urine, pain in the lower back or sides Redness, blistering, peeling or loosening of the skin, including inside the mouth Sudden eye pain or change in vision such as blurry vision, seeing halos around lights, vision loss Thoughts of suicide or self-harm, worsening mood, feelings  of depression Side effects that usually do not require medical attention (report to your care team if they continue or are bothersome): Burning or tingling sensation in hands or feet Difficulty with paying attention, memory, or speech Dizziness Drowsiness Fatigue Loss of appetite with weight loss Slow or sluggish movements of the body This list may not describe all possible side effects. Call your doctor for medical advice about side effects. You may report side effects to FDA at 1-800-FDA-1088. Where should I keep my medication? Keep out of the reach of children and pets. Store between 15 and 30 degrees C (59 and 86 degrees F). Protect from moisture. Keep the container tightly closed. Get rid of any unused medication after the expiration date. To get rid of medications that are no longer needed or have expired: Take the medication to a medication take-back program. Check with your pharmacy or law enforcement to find a location. If you cannot return the medication, check the label or package insert to see if the medication should be thrown out in the garbage or flushed down the toilet. If you are not sure, ask your care team. If it is safe to put it in the trash, empty the medication out of the container. Mix the medication with cat litter, dirt, coffee grounds, or other unwanted substance. Seal the mixture in a bag or container. Put it in the trash. NOTE: This sheet is a summary. It may not cover all possible information. If you have questions about this medicine, talk to your doctor, pharmacist, or health care provider.  2023 Elsevier/Gold Standard (2020-08-11 00:00:00) Migraine Headache A migraine headache is an intense, throbbing pain on one side or both sides of the head. Migraine headaches may also cause other symptoms, such as nausea, vomiting, and sensitivity to light and noise. A migraine headache can last from 4 hours to 3 days. Talk with your doctor about what things may bring on  (trigger) your migraine headaches. What are the causes? The exact cause of this condition is not known. However, a migraine may be caused when nerves in the brain become irritated and release chemicals that cause inflammation of blood vessels. This inflammation causes pain. This condition may be triggered or caused by: Drinking alcohol. Smoking. Taking medicines, such as: Medicine used to treat chest pain (nitroglycerin). Birth control pills. Estrogen. Certain blood pressure medicines. Eating or drinking products that contain nitrates, glutamate, aspartame, or tyramine. Aged cheeses, chocolate, or caffeine may also be triggers. Doing physical activity. Other things that may trigger a migraine headache include: Menstruation. Pregnancy. Hunger. Stress. Lack of sleep or too much sleep. Weather changes. Fatigue. What increases the risk? The following factors may make you more likely to experience migraine headaches: Being a certain age. This condition is more common in people who are 20-77 years old. Being male. Having a family history of migraine headaches. Being Caucasian. Having a mental health condition, such as depression or anxiety. Being obese. What are the signs or symptoms? The main symptom of this condition is pulsating or throbbing pain. This pain may: Happen in any area of the head, such as on one side or both sides. Interfere with daily activities. Get worse  with physical activity. Get worse with exposure to bright lights or loud noises. Other symptoms may include: Nausea. Vomiting. Dizziness. General sensitivity to bright lights, loud noises, or smells. Before you get a migraine headache, you may get warning signs (an aura). An aura may include: Seeing flashing lights or having blind spots. Seeing bright spots, halos, or zigzag lines. Having tunnel vision or blurred vision. Having numbness or a tingling feeling. Having trouble talking. Having muscle  weakness. Some people have symptoms after a migraine headache (postdromal phase), such as: Feeling tired. Difficulty concentrating. How is this diagnosed? A migraine headache can be diagnosed based on: Your symptoms. A physical exam. Tests, such as: CT scan or an MRI of the head. These imaging tests can help rule out other causes of headaches. Taking fluid from the spine (lumbar puncture) and analyzing it (cerebrospinal fluid analysis, or CSF analysis). How is this treated? This condition may be treated with medicines that: Relieve pain. Relieve nausea. Prevent migraine headaches. Treatment for this condition may also include: Acupuncture. Lifestyle changes like avoiding foods that trigger migraine headaches. Biofeedback. Cognitive behavioral therapy. Follow these instructions at home: Medicines Take over-the-counter and prescription medicines only as told by your health care provider. Ask your health care provider if the medicine prescribed to you: Requires you to avoid driving or using heavy machinery. Can cause constipation. You may need to take these actions to prevent or treat constipation: Drink enough fluid to keep your urine pale yellow. Take over-the-counter or prescription medicines. Eat foods that are high in fiber, such as beans, whole grains, and fresh fruits and vegetables. Limit foods that are high in fat and processed sugars, such as fried or sweet foods. Lifestyle Do not drink alcohol. Do not use any products that contain nicotine or tobacco, such as cigarettes, e-cigarettes, and chewing tobacco. If you need help quitting, ask your health care provider. Get at least 8 hours of sleep every night. Find ways to manage stress, such as meditation, deep breathing, or yoga. General instructions Keep a journal to find out what may trigger your migraine headaches. For example, write down: What you eat and drink. How much sleep you get. Any change to your diet or  medicines. If you have a migraine headache: Avoid things that make your symptoms worse, such as bright lights. It may help to lie down in a dark, quiet room. Do not drive or use heavy machinery. Ask your health care provider what activities are safe for you while you are experiencing symptoms. Keep all follow-up visits as told by your health care provider. This is important. Contact a health care provider if: You develop symptoms that are different or more severe than your usual migraine headache symptoms. You have more than 15 headache days in one month. Get help right away if: Your migraine headache becomes severe. Your migraine headache lasts longer than 72 hours. You have a fever. You have a stiff neck. You have vision loss. Your muscles feel weak or like you cannot control them. You start to lose your balance often. You have trouble walking. You faint. You have a seizure. Summary A migraine headache is an intense, throbbing pain on one side or both sides of the head. Migraines may also cause other symptoms, such as nausea, vomiting, and sensitivity to light and noise. This condition may be treated with medicines and lifestyle changes. You may also need to avoid certain things that trigger a migraine headache. Keep a journal to find out what may trigger  your migraine headaches. Contact your health care provider if you have more than 15 headache days in a month or you develop symptoms that are different or more severe than your usual migraine headache symptoms. This information is not intended to replace advice given to you by your health care provider. Make sure you discuss any questions you have with your health care provider. Document Revised: 10/29/2021 Document Reviewed: 06/29/2018 Elsevier Patient Education  2023 [employer].

## 2022-05-06 LAB — BASIC METABOLIC PANEL
BUN/Creatinine Ratio: 11 (ref 9–20)
BUN: 12 mg/dL (ref 6–24)
CO2: 24 mmol/L (ref 20–29)
Calcium: 9.7 mg/dL (ref 8.7–10.2)
Chloride: 98 mmol/L (ref 96–106)
Creatinine, Ser: 1.07 mg/dL (ref 0.76–1.27)
Glucose: 83 mg/dL (ref 70–99)
Potassium: 4.1 mmol/L (ref 3.5–5.2)
Sodium: 140 mmol/L (ref 134–144)
eGFR: 81 mL/min/{1.73_m2} (ref >59)

## 2022-05-11 ENCOUNTER — Telehealth: Payer: Self-pay | Admitting: Neurology

## 2022-05-11 NOTE — Telephone Encounter (Signed)
[facility] Numbers: [id number] exp. 05/11/2022 - 06/09/2022 sent to [facility] [phone]

## 2022-06-07 ENCOUNTER — Ambulatory Visit: Payer: BC Managed Care – PPO | Admitting: Neurology

## 2022-06-07 ENCOUNTER — Encounter: Payer: Self-pay | Admitting: Neurology

## 2022-06-07 DIAGNOSIS — R55 Syncope and collapse: Secondary | ICD-10-CM | POA: Diagnosis not present

## 2022-06-07 DIAGNOSIS — R42 Dizziness and giddiness: Secondary | ICD-10-CM

## 2022-06-07 NOTE — Procedures (Signed)
    History: 57 year old man with paroxysmal events concerning for seizure.   EEG classification: Awake and drowsy  Description of the recording: The background rhythms of this recording consists of a fairly well modulated low amplitude beta activity likely related to medication side effect. Present in the anterior head region is a 15-20 Hz beta activity. Photic stimulation was performed, did not show any abnormalities. Hyperventilation was also performed, did not show any abnormalities. Drowsiness was manifested by background fragmentation. No abnormal epileptiform discharges seen during this recording. There was no focal slowing. There were no electrographic seizure identified.   Abnormality: None   Impression: This is a normal EEG recorded while drowsy and awake. No evidence of interictal epileptiform discharges. Normal EEGs, however, do not rule out epilepsy.    Alric Ran, MD [facility]

## 2022-06-18 ENCOUNTER — Encounter: Payer: Self-pay | Admitting: Neurology

## 2022-06-21 ENCOUNTER — Ambulatory Visit
Admission: RE | Admit: 2022-06-21 | Discharge: 2022-06-21 | Disposition: A | Discharge status: Home / Self Care | Payer: BC Managed Care – PPO | Source: Ambulatory Visit | Attending: Neurology | Admitting: Neurology

## 2022-06-21 ENCOUNTER — Other Ambulatory Visit: Payer: Self-pay

## 2022-06-21 DIAGNOSIS — R42 Dizziness and giddiness: Secondary | ICD-10-CM | POA: Diagnosis not present

## 2022-06-21 DIAGNOSIS — G450 Vertebro-basilar artery syndrome: Secondary | ICD-10-CM

## 2022-06-21 DIAGNOSIS — R55 Syncope and collapse: Secondary | ICD-10-CM | POA: Diagnosis not present

## 2022-06-21 DIAGNOSIS — M50322 Other cervical disc degeneration at C5-C6 level: Secondary | ICD-10-CM | POA: Diagnosis not present

## 2022-06-21 DIAGNOSIS — I6523 Occlusion and stenosis of bilateral carotid arteries: Secondary | ICD-10-CM | POA: Diagnosis not present

## 2022-06-21 MED ORDER — IOPAMIDOL (ISOVUE-370) INJECTION 76%
75.0000 mL | Freq: Once | INTRAVENOUS | Status: AC | PRN
  Administered 2022-06-21: 75 mL via INTRAVENOUS

## 2022-07-07 ENCOUNTER — Ambulatory Visit: Payer: 59 | Admitting: Neurology

## 2022-07-20 DIAGNOSIS — Z0001 Encounter for general adult medical examination with abnormal findings: Secondary | ICD-10-CM | POA: Diagnosis not present

## 2022-07-20 DIAGNOSIS — I10 Essential (primary) hypertension: Secondary | ICD-10-CM | POA: Diagnosis not present

## 2022-07-20 DIAGNOSIS — Z125 Encounter for screening for malignant neoplasm of prostate: Secondary | ICD-10-CM | POA: Diagnosis not present

## 2023-01-18 DIAGNOSIS — L723 Sebaceous cyst: Secondary | ICD-10-CM | POA: Diagnosis not present

## 2023-01-18 DIAGNOSIS — R42 Dizziness and giddiness: Secondary | ICD-10-CM | POA: Diagnosis not present

## 2023-01-18 DIAGNOSIS — I10 Essential (primary) hypertension: Secondary | ICD-10-CM | POA: Diagnosis not present

## 2023-01-18 DIAGNOSIS — E669 Obesity, unspecified: Secondary | ICD-10-CM | POA: Diagnosis not present

## 2023-07-26 DIAGNOSIS — I10 Essential (primary) hypertension: Secondary | ICD-10-CM | POA: Diagnosis not present

## 2023-07-26 DIAGNOSIS — Z1159 Encounter for screening for other viral diseases: Secondary | ICD-10-CM | POA: Diagnosis not present

## 2023-07-26 DIAGNOSIS — E669 Obesity, unspecified: Secondary | ICD-10-CM | POA: Diagnosis not present

## 2023-07-26 DIAGNOSIS — Z125 Encounter for screening for malignant neoplasm of prostate: Secondary | ICD-10-CM | POA: Diagnosis not present

## 2023-07-26 DIAGNOSIS — Z13220 Encounter for screening for lipoid disorders: Secondary | ICD-10-CM | POA: Diagnosis not present

## 2023-07-27 DIAGNOSIS — I10 Essential (primary) hypertension: Secondary | ICD-10-CM | POA: Diagnosis not present

## 2023-07-27 DIAGNOSIS — E669 Obesity, unspecified: Secondary | ICD-10-CM | POA: Diagnosis not present

## 2023-07-27 DIAGNOSIS — R42 Dizziness and giddiness: Secondary | ICD-10-CM | POA: Diagnosis not present

## 2023-07-27 DIAGNOSIS — Z0000 Encounter for general adult medical examination without abnormal findings: Secondary | ICD-10-CM | POA: Diagnosis not present

## 2024-01-25 DIAGNOSIS — R42 Dizziness and giddiness: Secondary | ICD-10-CM | POA: Diagnosis not present

## 2024-01-25 DIAGNOSIS — N529 Male erectile dysfunction, unspecified: Secondary | ICD-10-CM | POA: Diagnosis not present

## 2024-01-25 DIAGNOSIS — R7301 Impaired fasting glucose: Secondary | ICD-10-CM | POA: Diagnosis not present

## 2024-01-25 DIAGNOSIS — I10 Essential (primary) hypertension: Secondary | ICD-10-CM | POA: Diagnosis not present
